# Patient Record
Sex: Male | Born: 1970 | Race: Black or African American | Hispanic: No | Smoking: Former smoker
Health system: Southern US, Community
[De-identification: ages and names within clinical notes are randomized; demographics above are authoritative.]

---

## 2005-09-10 ENCOUNTER — Emergency Department (HOSPITAL_COMMUNITY): Admission: EM | Admit: 2005-09-10 | Discharge: 2005-09-10 | Payer: Self-pay | Admitting: Emergency Medicine

## 2005-09-17 ENCOUNTER — Encounter: Admission: RE | Admit: 2005-09-17 | Discharge: 2005-09-17 | Payer: Self-pay | Admitting: Family Medicine

## 2019-04-05 ENCOUNTER — Other Ambulatory Visit: Payer: Self-pay

## 2019-04-05 ENCOUNTER — Encounter (HOSPITAL_COMMUNITY): Payer: Self-pay

## 2019-04-05 ENCOUNTER — Ambulatory Visit (HOSPITAL_COMMUNITY)
Admission: EM | Admit: 2019-04-05 | Discharge: 2019-04-05 | Disposition: A | Discharge status: Home / Self Care | Payer: Self-pay | Attending: Family Medicine | Admitting: Family Medicine

## 2019-04-05 ENCOUNTER — Ambulatory Visit (INDEPENDENT_AMBULATORY_CARE_PROVIDER_SITE_OTHER): Payer: Self-pay

## 2019-04-05 DIAGNOSIS — S62339A Displaced fracture of neck of unspecified metacarpal bone, initial encounter for closed fracture: Secondary | ICD-10-CM

## 2019-04-05 MED ORDER — IBUPROFEN 800 MG PO TABS: 800.0000 mg | ORAL_TABLET | Freq: Three times a day (TID) | ORAL | 0 refills | Status: AC

## 2019-04-05 MED ORDER — IBUPROFEN 800 MG PO TABS
ORAL_TABLET | ORAL | Status: AC
  Filled 2019-04-05: qty 1

## 2019-04-05 MED ORDER — TRAMADOL HCL 50 MG PO TABS: 50.0000 mg | ORAL_TABLET | Freq: Three times a day (TID) | ORAL | 0 refills | Status: AC | PRN

## 2019-04-05 MED ORDER — IBUPROFEN 800 MG PO TABS
800.0000 mg | ORAL_TABLET | Freq: Once | ORAL | Status: AC
  Administered 2019-04-05: 800 mg via ORAL

## 2019-04-05 NOTE — Discharge Instructions (Signed)
Ice, elevation to help with pain.  Regular ibuprofen.  Tramadol for breakthrough pain, can cause constipation and drowsiness.  Please call Dr. Tama Headings office tomorrow to set up follow up appointment.

## 2019-04-05 NOTE — ED Triage Notes (Signed)
Pt states he fell last night. Pt cc swelling and pain in his right hand.

## 2019-04-05 NOTE — ED Provider Notes (Signed)
[facility]    CSN: [id number] Arrival date & time: 04/05/19  1215     History   Chief Complaint Chief Complaint  Patient presents with  . Hand Pain    HPI Larry PierceJohn C Arrey is a 48 y.o. male.   Larry Mayer presents with complaints of pain and swelling to right hand after a fall last night. He fell forward, sliding, with hand striking up against a door frame. No numbness or tingling. Pain 10/10. Hasn't taken any medications for pain. Skin intact. He is right handed. Denies any previous hand injuries. No wrist pain.  Without contributing medical history.     ROS per HPI, negative if not otherwise mentioned.      History reviewed. No pertinent past medical history.  There are no active problems to display for this patient.   History reviewed. No pertinent surgical history.     Home Medications    Prior to Admission medications   Medication Sig Start Date End Date Taking? Authorizing Provider  ibuprofen (ADVIL) 800 MG tablet Take 1 tablet (800 mg total) by mouth 3 (three) times daily. 04/05/19   Georgetta HaberBurky, Cliffton Spradley B, NP  traMADol (ULTRAM) 50 MG tablet Take 1 tablet (50 mg total) by mouth every 8 (eight) hours as needed for severe pain. 04/05/19   Georgetta HaberBurky, Shanyah Gattuso B, NP    Family History Family History  Problem Relation Age of Onset  . Heart failure Father     Social History Social History   Tobacco Use  . Smoking status: Former Smoker    Types: Cigarettes  . Smokeless tobacco: Never Used  Substance Use Topics  . Alcohol use: Yes  . Drug use: Never     Allergies   Patient has no known allergies.   Review of Systems Review of Systems   Physical Exam Triage Vital Signs ED Triage Vitals  Enc Vitals Group     BP 04/05/19 1240 119/82     Pulse Rate 04/05/19 1240 86     Resp 04/05/19 1240 18     Temp 04/05/19 1240 98 F (36.7 C)     Temp Source 04/05/19 1240 Temporal     SpO2 04/05/19 1240 100 %     Weight 04/05/19 1237 190 lb (86.2 kg)   Height --      Head Circumference --      Peak Flow --      Pain Score 04/05/19 1237 10     Pain Loc --      Pain Edu? --      Excl. in GC? --    No data found.  Updated Vital Signs BP 119/82 (BP Location: Right Arm)   Pulse 86   Temp 98 F (36.7 C) (Temporal)   Resp 18   Wt 190 lb (86.2 kg)   SpO2 100%    Physical Exam Constitutional:      Appearance: He is well-developed.  Cardiovascular:     Rate and Rhythm: Normal rate.  Pulmonary:     Effort: Pulmonary effort is normal.  Musculoskeletal:     Right hand: He exhibits decreased range of motion, tenderness, bony tenderness and swelling. He exhibits normal capillary refill, no deformity and no laceration. Normal sensation noted.       Hands:     Comments: Right lateral hand overlying 5th metacarpal with pain and swelling; limited ROM to 4th and 5th metacarpal due to pain and swelling; sensation intact, cap refill <2 seconds  Skin:  General: Skin is warm and dry.  Neurological:     Mental Status: He is alert and oriented to person, place, and time.      UC Treatments / Results  Labs (all labs ordered are listed, but only abnormal results are displayed) Labs Reviewed - No data to display  EKG   Radiology No results found.  Procedures Procedures (including critical care time)  Medications Ordered in UC Medications  ibuprofen (ADVIL) tablet 800 mg (800 mg Oral Given 04/05/19 1339)  ibuprofen (ADVIL) 800 MG tablet (has no administration in time range)    Initial Impression / Assessment and Plan / UC Course  I have reviewed the triage vital signs and the nursing notes.  Pertinent labs & imaging results that were available during my care of the patient were reviewed by me and considered in my medical decision making (see chart for details).    5th metacarpal fracture. Ulnar gutter placed. Chart info provided to Dr. Doreatha Martin on call, patient directed to call his office tomorrow for follow up. Pain management  discussed. Patient verbalized understanding and agreeable to plan.   Final Clinical Impressions(s) / UC Diagnoses   Final diagnoses:  Closed boxer's fracture, initial encounter     Discharge Instructions     Ice, elevation to help with pain.  Regular ibuprofen.  Tramadol for breakthrough pain, can cause constipation and drowsiness.  Please call Dr. Tama Headings office tomorrow to set up follow up appointment.     ED Prescriptions    Medication Sig Dispense Auth. Provider   ibuprofen (ADVIL) 800 MG tablet Take 1 tablet (800 mg total) by mouth 3 (three) times daily. 21 tablet Augusto Gamble B, NP   traMADol (ULTRAM) 50 MG tablet Take 1 tablet (50 mg total) by mouth every 8 (eight) hours as needed for severe pain. 10 tablet Zigmund Gottron, NP     Controlled Substance Prescriptions [place] Controlled Substance Registry consulted? Not Applicable   Zigmund Gottron, NP 04/05/19 1404

## 2019-04-05 NOTE — Progress Notes (Signed)
Orthopedic Tech Progress Note Patient Details:  Larry Mayer 11-27-1970 [id number]  Ortho Devices Type of Ortho Device: Ulna gutter splint Ortho Device/Splint Location: right Ortho Device/Splint Interventions: Application   Post Interventions Patient Tolerated: Well Instructions Provided: Care of device   [place] Pink 04/05/2019, 2:05 PM

## 2020-08-28 IMAGING — DX RIGHT HAND - COMPLETE 3+ VIEW
3 series · 3 of 3 positions shown · non-contrast
Comparison: No priors.

CLINICAL DATA: 47-year-old male with history of trauma from a fall
complaining of pain and swelling in the right hand. Limited range of
motion.

EXAM:
RIGHT HAND - COMPLETE 3+ VIEW

[hand pa]
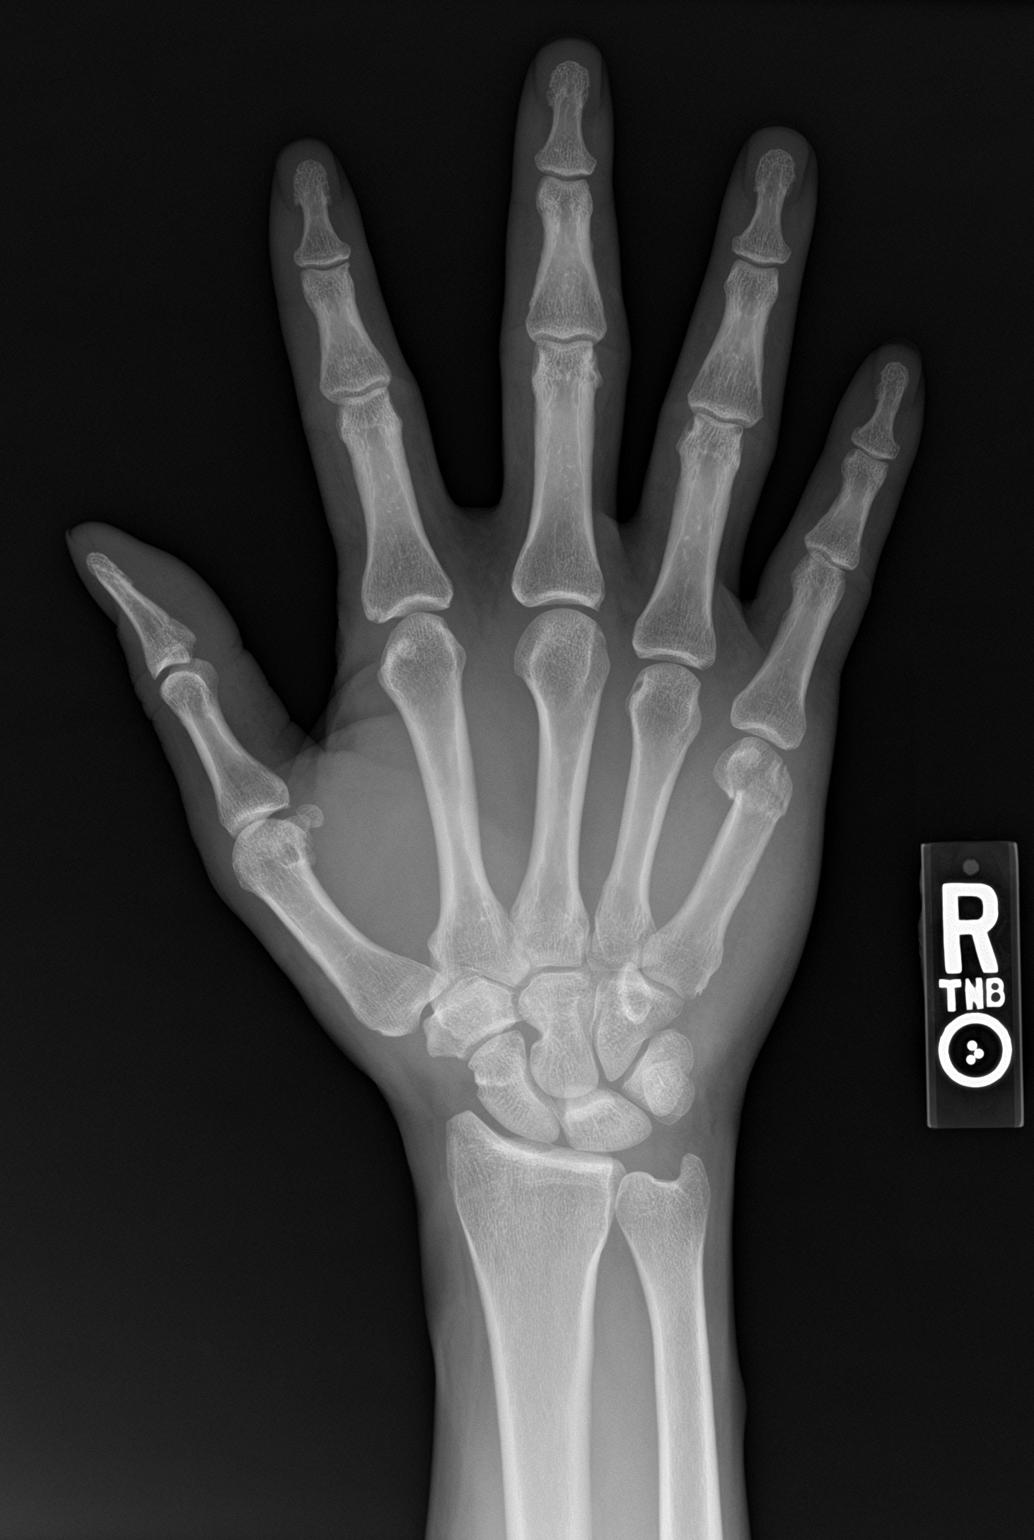

[hand obl]
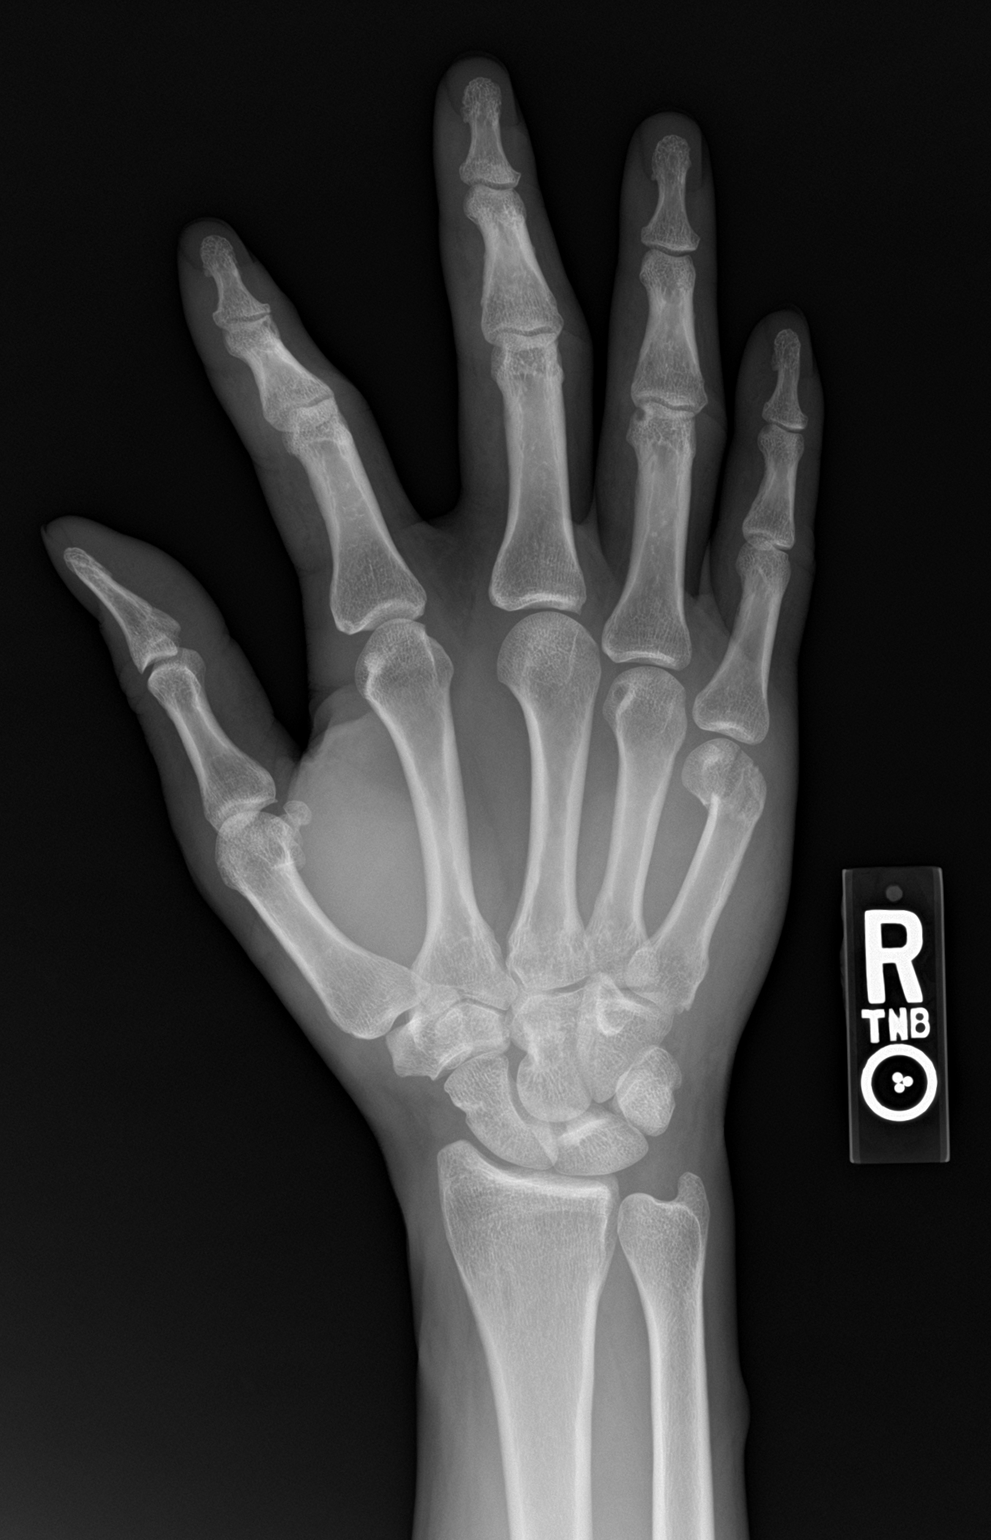

[hand lat]
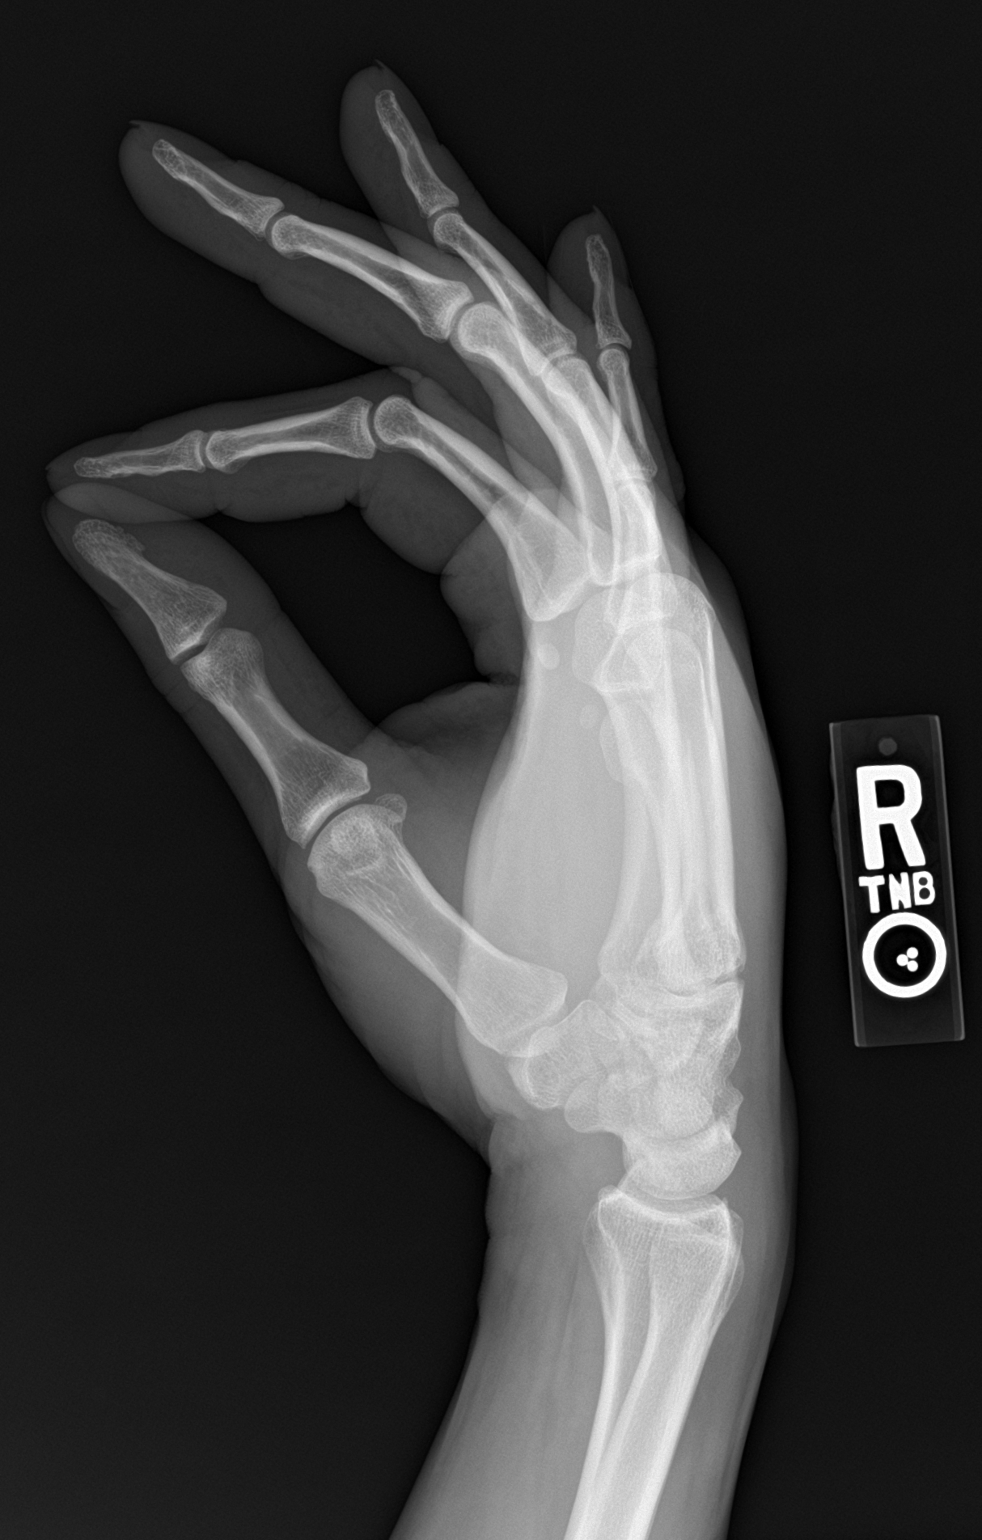

[3 of 3 positions shown; findings below may reference images not displayed]

FINDINGS: Comminuted intra-articular fracture of the head of the fifth
metacarpal, which also demonstrates mild volar angulation. Soft
tissues adjacent to this are swollen. No other acute displaced
fracture, subluxation or dislocation.
IMPRESSION: 1. Comminuted mildly angulated intra-articular fracture of the head
of the fifth metacarpal, as above.

## 2021-05-20 ENCOUNTER — Other Ambulatory Visit: Payer: Self-pay

## 2021-05-20 ENCOUNTER — Encounter (HOSPITAL_COMMUNITY): Payer: Self-pay | Admitting: Emergency Medicine

## 2021-05-20 ENCOUNTER — Ambulatory Visit (HOSPITAL_COMMUNITY)
Admission: EM | Admit: 2021-05-20 | Discharge: 2021-05-20 | Disposition: A | Discharge status: Home / Self Care | Payer: Self-pay

## 2021-05-20 DIAGNOSIS — M25561 Pain in right knee: Secondary | ICD-10-CM

## 2021-05-20 MED ORDER — TIZANIDINE HCL 2 MG PO TABS
2.0000 mg | ORAL_TABLET | Freq: Three times a day (TID) | ORAL | 0 refills | Status: AC | PRN
Start: 2021-05-20 — End: ?

## 2021-05-20 NOTE — ED Triage Notes (Signed)
Pt c/o intermittent right posterior knee pain. Denies injuries. Reports has plantar fascitis in right foot.

## 2021-05-20 NOTE — ED Provider Notes (Signed)
[facility]    CSN: [id number] Arrival date & time: 05/20/21  1055      History   Chief Complaint Chief Complaint  Patient presents with   Knee Pain    posterior    HPI Larry Mayer is a 50 y.o. male presenting with posterior knee pain for about 2 days.  Medical history noncontributory.  Patient denies trauma or overuse, and so he is very concerned about his right knee pain.  States this is worse with extension and flexion, and ambulation.  Pain is over the patellar tendon and posterior knee.  States he is concerned that he has a blood clot, though he denies swelling, calf pain, pain traveling up the leg, shortness of breath, chest pain; denies history DVT or PE.  Denies recent travel or immobilization, hormone replacement, trauma, surgery, etc.  He states that ibuprofen 600 mg is not providing relief.  Denies sensation changes, pain or injury elsewhere. Adamant that he requires something for pain relief.  HPI  History reviewed. No pertinent past medical history.  There are no problems to display for this patient.   History reviewed. No pertinent surgical history.     Home Medications    Prior to Admission medications   Medication Sig Start Date End Date Taking? Authorizing Provider  omeprazole (PRILOSEC) 20 MG capsule TAKE ONE CAPSULE BY MOUTH DAILY (TAKE ON AN EMPTY STOMACH 30 MINUTES PRIOR TO A MEAL) 03/20/21  Yes [provider]  tiZANidine (ZANAFLEX) 2 MG tablet Take 1 tablet (2 mg total) by mouth every 8 (eight) hours as needed for muscle spasms. 05/20/21  Yes Rhys Martini, PA-C  ibuprofen (ADVIL) 800 MG tablet Take 1 tablet (800 mg total) by mouth 3 (three) times daily. 04/05/19   Georgetta Haber, NP  traMADol (ULTRAM) 50 MG tablet Take 1 tablet (50 mg total) by mouth every 8 (eight) hours as needed for severe pain. 04/05/19   Georgetta Haber, NP    Family History Family History  Problem Relation Age of Onset   Heart failure Father     Social  History Social History   Tobacco Use   Smoking status: Former    Types: Cigarettes   Smokeless tobacco: Never  Substance Use Topics   Alcohol use: Yes   Drug use: Never     Allergies   Patient has no known allergies.   Review of Systems Review of Systems  Musculoskeletal:        R knee pain  All other systems reviewed and are negative.   Physical Exam Triage Vital Signs ED Triage Vitals  Enc Vitals Group     BP 05/20/21 1202 (!) 136/96     Pulse Rate 05/20/21 1202 79     Resp 05/20/21 1202 18     Temp 05/20/21 1202 98.3 F (36.8 C)     Temp Source 05/20/21 1202 Oral     SpO2 05/20/21 1202 98 %     Weight --      Height --      Head Circumference --      Peak Flow --      Pain Score 05/20/21 1200 9     Pain Loc --      Pain Edu? --      Excl. in GC? --    No data found.  Updated Vital Signs BP (!) 136/96 (BP Location: Left Arm)   Pulse 79   Temp 98.3 F (36.8 C) (Oral)  Resp 18   SpO2 98%   Visual Acuity Right Eye Distance:   Left Eye Distance:   Bilateral Distance:    Right Eye Near:   Left Eye Near:    Bilateral Near:     Physical Exam Vitals reviewed.  Constitutional:      General: He is not in acute distress.    Appearance: Normal appearance. He is not ill-appearing or diaphoretic.  HENT:     Head: Normocephalic and atraumatic.  Cardiovascular:     Rate and Rhythm: Normal rate and regular rhythm.     Heart sounds: Normal heart sounds.  Pulmonary:     Effort: Pulmonary effort is normal.     Breath sounds: Normal breath sounds.  Musculoskeletal:     Comments: R knee- no effusion or obvious deformity. Knees are symmetric and without calf tenderness; negative homan sign. No tenderness to palpation, but posterior knee and patellar tendon insertion pain with flexion and extension. No crepitus or joint laxity. Negative valgus and varus stress test. Negative mcmurray. Ambulating with pain. DP 2+, cap refill <2 seconds, sensation intact.    Skin:    General: Skin is warm.  Neurological:     General: No focal deficit present.     Mental Status: He is alert and oriented to person, place, and time.  Psychiatric:        Mood and Affect: Mood normal.        Behavior: Behavior normal.        Thought Content: Thought content normal.        Judgment: Judgment normal.     UC Treatments / Results  Labs (all labs ordered are listed, but only abnormal results are displayed) Labs Reviewed - No data to display  EKG   Radiology No results found.  Procedures Procedures (including critical care time)  Medications Ordered in UC Medications - No data to display  Initial Impression / Assessment and Plan / UC Course  I have reviewed the triage vital signs and the nursing notes.  Pertinent labs & imaging results that were available during my care of the patient were reviewed by me and considered in my medical decision making (see chart for details).     This patient is a very pleasant 50 y.o. year old male presenting with R knee strain, nontraumatic. Reassuring exam.  Patient expresses concern for DVT, wells score 0. Trial of knee brace, zanaflex, f/u with [facility] or PCP at the [place]. ED return precautions discussed. Patient verbalizes understanding and agreement.     Final Clinical Impressions(s) / UC Diagnoses   Final diagnoses:  Acute pain of right knee     Discharge Instructions      -Start the muscle relaxer-Zanaflex (tizanidine), up to 3 times daily for muscle spasms and pain.  This can make you drowsy, so take at bedtime or when you do not need to drive or operate machinery. -You can take Tylenol up to 1000 mg 3 times daily, and ibuprofen up to 800 mg 3 times daily with food.  You can take these together, or alternate every 3-4 hours. -If symptoms persist in 3 days, follow-up with an orthopedist. I recommend [facility] at [address] [place], [place] [address]. You can schedule an appointment by calling  [phone]) or online ([email or web address]), but they also have a walk-in clinic M-F 8a-8p and Sat 10a-3p. You could alternatively follow-up with your PCP.  -I do not think that you have a blood clot today.  Symptoms look  out for are increasing swelling in your knee, swelling traveling up the leg, shortness of breath, chest pain.  If you develop the symptoms, head to the emergency department.    ED Prescriptions     Medication Sig Dispense Auth. Provider   tiZANidine (ZANAFLEX) 2 MG tablet Take 1 tablet (2 mg total) by mouth every 8 (eight) hours as needed for muscle spasms. 21 tablet Rhys Martini, PA-C      PDMP not reviewed this encounter.   Rhys Martini, PA-C 05/20/21 1229

## 2021-05-20 NOTE — Discharge Instructions (Addendum)
-  Start the muscle relaxer-Zanaflex (tizanidine), up to 3 times daily for muscle spasms and pain.  This can make you drowsy, so take at bedtime or when you do not need to drive or operate machinery. -You can take Tylenol up to 1000 mg 3 times daily, and ibuprofen up to 800 mg 3 times daily with food.  You can take these together, or alternate every 3-4 hours. -If symptoms persist in 3 days, follow-up with an orthopedist. I recommend [facility] at [address], [place], [place] [address]. You can schedule an appointment by calling [phone]) or online ([email or web address]), but they also have a walk-in clinic M-F 8a-8p and Sat 10a-3p. You could alternatively follow-up with your PCP.  -I do not think that you have a blood clot today.  Symptoms look out for are increasing swelling in your knee, swelling traveling up the leg, shortness of breath, chest pain.  If you develop the symptoms, head to the emergency department.

## 2024-09-11 ENCOUNTER — Emergency Department (HOSPITAL_BASED_OUTPATIENT_CLINIC_OR_DEPARTMENT_OTHER): Admitting: Radiology

## 2024-09-11 ENCOUNTER — Other Ambulatory Visit: Payer: Self-pay

## 2024-09-11 ENCOUNTER — Emergency Department (HOSPITAL_BASED_OUTPATIENT_CLINIC_OR_DEPARTMENT_OTHER)
Admission: EM | Admit: 2024-09-11 | Discharge: 2024-09-11 | Disposition: A | Discharge status: Home / Self Care | Source: Ambulatory Visit | Attending: Emergency Medicine | Admitting: Emergency Medicine

## 2024-09-11 ENCOUNTER — Encounter (HOSPITAL_BASED_OUTPATIENT_CLINIC_OR_DEPARTMENT_OTHER): Payer: Self-pay

## 2024-09-11 DIAGNOSIS — R0789 Other chest pain: Secondary | ICD-10-CM | POA: Insufficient documentation

## 2024-09-11 DIAGNOSIS — R0602 Shortness of breath: Secondary | ICD-10-CM | POA: Diagnosis not present

## 2024-09-11 DIAGNOSIS — R079 Chest pain, unspecified: Secondary | ICD-10-CM

## 2024-09-11 LAB — CBC
HCT: 49.7 % (ref 39.0–52.0)
Hemoglobin: 16.9 g/dL (ref 13.0–17.0)
MCH: 29 pg (ref 26.0–34.0)
MCHC: 34 g/dL (ref 30.0–36.0)
MCV: 85.2 fL (ref 80.0–100.0)
Platelets: 220 K/uL (ref 150–400)
RBC: 5.83 MIL/uL — ABNORMAL HIGH (ref 4.22–5.81)
RDW: 14.4 % (ref 11.5–15.5)
WBC: 14.9 K/uL — ABNORMAL HIGH (ref 4.0–10.5)
nRBC: 0 % (ref 0.0–0.2)

## 2024-09-11 LAB — BASIC METABOLIC PANEL WITH GFR
Anion gap: 11 (ref 5–15)
BUN: 13 mg/dL (ref 6–20)
CO2: 25 mmol/L (ref 22–32)
Calcium: 10.5 mg/dL — ABNORMAL HIGH (ref 8.9–10.3)
Chloride: 101 mmol/L (ref 98–111)
Creatinine, Ser: 0.91 mg/dL (ref 0.61–1.24)
GFR, Estimated: >60 mL/min
Glucose, Bld: 97 mg/dL (ref 70–99)
Potassium: 4.1 mmol/L (ref 3.5–5.1)
Sodium: 137 mmol/L (ref 135–145)

## 2024-09-11 LAB — TROPONIN T, HIGH SENSITIVITY
Troponin T High Sensitivity: <15 ng/L (ref 0–19)
Troponin T High Sensitivity: <15 ng/L (ref 0–19)

## 2024-09-11 LAB — D-DIMER, QUANTITATIVE: D-Dimer, Quant: 0.32 ug{FEU}/mL (ref 0.00–0.50)

## 2024-09-11 NOTE — ED Notes (Signed)
 ..  The patient is A&OX4, ambulatory at d/c with independent steady gait, NAD. Pt verbalized understanding of d/c instructions and follow up care.

## 2024-09-11 NOTE — ED Provider Notes (Signed)
 " [place] EMERGENCY DEPARTMENT AT [facility] Provider Note   CSN: [id number] Arrival date & time: 09/11/24  1601     Patient presents with: Chest Pain and Shortness of Breath   Larry Mayer is a 54 y.o. male with no significant past medical history who presents to the ED due to intermittent left-sided chest pain x 3 to 4 days.  Describes chest pain as a tight sensation in the left side of his chest.  Occasionally gets left arm pain as well.  Also admits to intermittent shortness of breath typically when bending over doing things.  Denies exertional chest pain.  No history of blood clots, recent surgeries, recent long immobilizations, or hormonal treatments.  No cardiac history.  Denies lower extremity edema.  Patient was sent by the [facility] for further evaluation.  Patient is currently chest pain-free. No history of asthma. Notes he breathing feels different.   History obtained from patient and past medical records. No interpreter used during encounter.       Prior to Admission medications  Medication Sig Start Date End Date Taking? Authorizing Provider  ibuprofen  (ADVIL ) 800 MG tablet Take 1 tablet (800 mg total) by mouth 3 (three) times daily. 04/05/19   Burky, Natalie B, NP  omeprazole (PRILOSEC) 20 MG capsule TAKE ONE CAPSULE BY MOUTH DAILY (TAKE ON AN EMPTY STOMACH 30 MINUTES PRIOR TO A MEAL) 03/20/21   [provider]  tiZANidine  (ZANAFLEX ) 2 MG tablet Take 1 tablet (2 mg total) by mouth every 8 (eight) hours as needed for muscle spasms. 05/20/21   Graham, Laura E, PA-C  traMADol  (ULTRAM ) 50 MG tablet Take 1 tablet (50 mg total) by mouth every 8 (eight) hours as needed for severe pain. 04/05/19   Burky, Natalie B, NP    Allergies: Patient has no known allergies.    Review of Systems  Constitutional:  Negative for chills and fever.  Respiratory:  Positive for shortness of breath.   Cardiovascular:  Positive for chest pain. Negative for leg swelling.    Updated Vital  Signs BP (!) 135/90   Pulse 88   Temp 98.3 F (36.8 C)   Resp 18   SpO2 98%   Physical Exam Vitals and nursing note reviewed.  Constitutional:      General: He is not in acute distress.    Appearance: He is not ill-appearing.  HENT:     Head: Normocephalic.  Eyes:     Pupils: Pupils are equal, round, and reactive to light.  Cardiovascular:     Rate and Rhythm: Normal rate and regular rhythm.     Pulses: Normal pulses.     Heart sounds: Normal heart sounds. No murmur heard.    No friction rub. No gallop.  Pulmonary:     Effort: Pulmonary effort is normal.     Breath sounds: Normal breath sounds.     Comments: Respirations equal and unlabored, patient able to speak in full sentences, lungs clear to auscultation bilaterally Abdominal:     General: Abdomen is flat. There is no distension.     Palpations: Abdomen is soft.     Tenderness: There is no abdominal tenderness. There is no guarding or rebound.  Musculoskeletal:        General: Normal range of motion.     Cervical back: Neck supple.     Comments: No lower extremity edema  Skin:    General: Skin is warm and dry.  Neurological:     General: No  focal deficit present.     Mental Status: He is alert.  Psychiatric:        Mood and Affect: Mood normal.        Behavior: Behavior normal.     (all labs ordered are listed, but only abnormal results are displayed) Labs Reviewed  BASIC METABOLIC PANEL WITH GFR - Abnormal; Notable for the following components:      Result Value   Calcium 10.5 (*)    All other components within normal limits  CBC - Abnormal; Notable for the following components:   WBC 14.9 (*)    RBC 5.83 (*)    All other components within normal limits  D-DIMER, QUANTITATIVE  TROPONIN T, HIGH SENSITIVITY  TROPONIN T, HIGH SENSITIVITY    EKG: EKG Interpretation Date/Time:  Friday September 11 2024 16:18:00 EST Ventricular Rate:  81 PR Interval:  169 QRS Duration:  96 QT Interval:  358 QTC  Calculation: 416 R Axis:   -27  Text Interpretation: Sinus rhythm Borderline left axis deviation RSR' in V1 or V2, probably normal variant No old tracing to compare Confirmed by Emil Share [phone]) on 09/11/2024 4:19:49 PM  Radiology: [facility] Chest 2 View Result Date: 09/11/2024 EXAM: 2 VIEW(S) XRAY OF THE CHEST 09/11/2024 04:30:45 PM COMPARISON: None available. CLINICAL HISTORY: shob FINDINGS: LUNGS AND PLEURA: No focal pulmonary opacity. No pleural effusion. No pneumothorax. HEART AND MEDIASTINUM: No acute abnormality of the cardiac and mediastinal silhouettes. BONES AND SOFT TISSUES: No acute osseous abnormality. IMPRESSION: 1. No acute cardiopulmonary abnormality. Electronically signed by: Greig Pique MD 09/11/2024 04:41 PM EST RP Workstation: [id number]     Procedures   Medications Ordered in the ED - No data to display  Clinical Course as of 09/11/24 1848  Fri Sep 11, 2024  1708 Troponin T High Sensitivity: <15 [CA]  1708 WBC(!): 14.9 [CA]  1724 D-Dimer, Quant: 0.32 [CA]    Clinical Course User Index [CA] Lorelle Aleck BROCKS, PA-C                                 Medical Decision Making Amount and/or Complexity of Data Reviewed Labs: ordered. Decision-making details documented in ED Course. Radiology: ordered and independent interpretation performed. Decision-making details documented in ED Course. ECG/medicine tests: ordered and independent interpretation performed. Decision-making details documented in ED Course.   This patient presents to the ED for concern of CP/SOB, this involves an extensive number of treatment options, and is a complaint that carries with it a high risk of complications and morbidity.  The differential diagnosis includes ACS, PE, aortic dissection, PNA, etc  54 year old male presents to the ED due to left-sided intermittent chest pain x 3 to 4 days.  Seen at the [facility] prior to arrival and sent to the ED for further evaluation.  Also endorses intermittent  shortness of breath.  Denies cardiac history.  No history of asthma or CHF.  Denies lower extremity edema.  No history of blood clots.  Upon arrival patient afebrile, not tachycardic or hypoxic.  Patient well-appearing on exam.  No lower extremity edema.  Lungs clear to auscultation bilaterally.  Cardiac labs ordered.  Chest x-ray to rule out evidence of pneumonia.  Added D-dimer to rule out PE.  EKG and troponin to rule out ACS.  Patient is currently chest pain-free during initial evaluation.  CBC with leukocytosis at 14.9.  Normal hemoglobin.  BMP reassuring.  Normal renal function.  No  major electrolyte derangements.  D-dimer normal.  Low suspicion for PE.  troponin normal x2.  EKG normal sinus rhythm.  Heart rate 81. Low suspicion for ACS.  Chest x-ray personally reviewed and interpreted which is negative for signs of pneumonia, pneumothorax, widened mediastinum.  Unclear etiology of chest pain.  Low suspicion for ACS, PE, or aortic dissection.  Patient has remained chest pain-free during his entire ED stay.  Cardiology referral placed to patient at discharge.  Advised patient to return to the ER for any new or worsening symptoms.  Patient stable for discharge. Strict ED precautions discussed with patient. Patient states understanding and agrees to plan. Patient discharged home in no acute distress and stable vitals  [facility] Lives independently    Final diagnoses:  Nonspecific chest pain  Shortness of breath    ED Discharge Orders          Ordered    Ambulatory referral to Cardiology       Comments: If you have not heard from the Cardiology office within the next 72 hours please call [phone].   09/11/24 TRENNA Lorelle Aleck JAYSON, PA-C 09/11/24 AMOS Emil Share, DO 09/11/24 1939  "

## 2024-09-11 NOTE — ED Notes (Signed)
 Lab to add on Troponin to previously collected labs while in triage.

## 2024-09-11 NOTE — ED Triage Notes (Signed)
 Pt c/o tightness in chest, SHOB on exertion intermittent for awhile (4-5), worse over last 3-4 days. Denies resp/ cards hx. Denies swelling, hx HTN, additional concer.  Seen at [facility], referred for further workup

## 2024-09-11 NOTE — Discharge Instructions (Addendum)
 It was a pleasure taking care of you today. As discussed, your work-up was reassuring. Your cardiac labs were normal. I have placed a referral to cardiology. They will call you next week to set up an appointment. Return to the ER for new or worsening symptoms.
